# Patient Record
Sex: Male | Born: 2000
Health system: Southern US, Community
[De-identification: ages and names within clinical notes are randomized; demographics above are authoritative.]

## PROBLEM LIST (undated history)

## (undated) DIAGNOSIS — F341 Dysthymic disorder: Secondary | ICD-10-CM

## (undated) DIAGNOSIS — F419 Anxiety disorder, unspecified: Secondary | ICD-10-CM

## (undated) HISTORY — DX: Anxiety disorder, unspecified: F41.9

## (undated) HISTORY — DX: Dysthymic disorder: F34.1

---
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
## 2020-01-27 ENCOUNTER — Telehealth: Payer: Self-pay | Admitting: Psychiatry

## 2020-01-27 DIAGNOSIS — F411 Generalized anxiety disorder: Secondary | ICD-10-CM

## 2020-01-27 DIAGNOSIS — F4010 Social phobia, unspecified: Secondary | ICD-10-CM

## 2020-01-27 NOTE — Telephone Encounter (Signed)
Pt called and said that the buspar is not really working as well and he would like either something different or an extended release. He wakes up in the morning and he is not in a good mood. Please call him at [phone]

## 2020-01-27 NOTE — Telephone Encounter (Signed)
Phone call is returned at [phone] when disovering message after hours at 1704 that he does not feel good in the morning he attributes to lack of benefit from BuSpar asking if extended release would be better but there is not an extended release made. Otherwise he wants an alternative though he has not declared a follow up with provider in [place] or here for February or sooner, and I am retiring in several weeks. Though the patient could substitute Anafranil 75 mg nightly for the BuSpar twice daily while continuing the Pristiq, he has many comorbidities about which new medications become conflictual when he wants them for his mental health.  He does not answer the phone when I call and has a child's voice providing his email message then that the mailbox is full.  If he can come up with a plan for follow-up in our office or elsewhere, it may be possible to try the Anafranil in place of the BuSpar in the interim.

## 2020-01-28 NOTE — Addendum Note (Signed)
Addended by: Beverly Milch E on: 01/28/2020 01:20 PM   Modules accepted: Orders

## 2020-01-28 NOTE — Telephone Encounter (Signed)
Multiple attempts to reach patient have been unsuccessful with answering machine still full so that message cannot be left.  Simplest question he might be asking is whether the BuSpar is wearing off in the morning as he states he wishes a time release for waking symptoms and the same can be achieved by 3 times a day dosing every 8 hours rather than just covering the waking hours of the day.  I left that message on mother's phone as there is no answer on father's phone either discussing options he addressed last time for appointments whether in our office or in [place] if more complicated doubting that any change to Anafranil is able to be secured for them currently as my reitrement nears.

## 2020-03-03 ENCOUNTER — Other Ambulatory Visit: Payer: Self-pay | Admitting: Psychiatry

## 2020-03-03 DIAGNOSIS — F411 Generalized anxiety disorder: Secondary | ICD-10-CM

## 2020-03-03 DIAGNOSIS — F4010 Social phobia, unspecified: Secondary | ICD-10-CM

## 2020-04-30 ENCOUNTER — Other Ambulatory Visit: Payer: Self-pay | Admitting: Psychiatry

## 2020-04-30 DIAGNOSIS — F4010 Social phobia, unspecified: Secondary | ICD-10-CM

## 2020-04-30 DIAGNOSIS — F411 Generalized anxiety disorder: Secondary | ICD-10-CM

## 2020-04-30 DIAGNOSIS — F341 Dysthymic disorder: Secondary | ICD-10-CM

## 2020-05-08 DIAGNOSIS — F411 Generalized anxiety disorder: Secondary | ICD-10-CM | POA: Diagnosis not present

## 2020-05-08 DIAGNOSIS — F8081 Childhood onset fluency disorder: Secondary | ICD-10-CM | POA: Diagnosis not present

## 2020-05-08 DIAGNOSIS — F321 Major depressive disorder, single episode, moderate: Secondary | ICD-10-CM | POA: Diagnosis not present

## 2020-06-04 ENCOUNTER — Other Ambulatory Visit: Payer: Self-pay | Admitting: Psychiatry

## 2020-06-04 DIAGNOSIS — F4010 Social phobia, unspecified: Secondary | ICD-10-CM

## 2020-06-04 DIAGNOSIS — F411 Generalized anxiety disorder: Secondary | ICD-10-CM

## 2020-06-04 NOTE — Telephone Encounter (Signed)
Call to RS

## 2020-06-09 NOTE — Telephone Encounter (Signed)
Left message for pt to call back to schedule.

## 2020-06-27 ENCOUNTER — Other Ambulatory Visit: Payer: Self-pay | Admitting: Psychiatry

## 2020-06-27 DIAGNOSIS — F411 Generalized anxiety disorder: Secondary | ICD-10-CM

## 2020-06-27 DIAGNOSIS — F4010 Social phobia, unspecified: Secondary | ICD-10-CM

## 2020-07-23 DIAGNOSIS — F411 Generalized anxiety disorder: Secondary | ICD-10-CM | POA: Diagnosis not present

## 2020-07-23 DIAGNOSIS — F321 Major depressive disorder, single episode, moderate: Secondary | ICD-10-CM | POA: Diagnosis not present

## 2020-08-05 ENCOUNTER — Other Ambulatory Visit: Payer: Self-pay | Admitting: Psychiatry

## 2020-08-05 DIAGNOSIS — F4010 Social phobia, unspecified: Secondary | ICD-10-CM

## 2020-08-05 DIAGNOSIS — F411 Generalized anxiety disorder: Secondary | ICD-10-CM

## 2020-08-18 ENCOUNTER — Ambulatory Visit (INDEPENDENT_AMBULATORY_CARE_PROVIDER_SITE_OTHER): Payer: BC Managed Care – PPO | Admitting: Family Medicine

## 2020-08-18 ENCOUNTER — Encounter: Payer: Self-pay | Admitting: Family Medicine

## 2020-08-18 ENCOUNTER — Other Ambulatory Visit: Payer: Self-pay

## 2020-08-18 VITALS — BP 107/69 | HR 75 | Temp 97.6°F | Ht 76.0 in | Wt 310.2 lb

## 2020-08-18 DIAGNOSIS — F411 Generalized anxiety disorder: Secondary | ICD-10-CM | POA: Diagnosis not present

## 2020-08-18 DIAGNOSIS — Z0001 Encounter for general adult medical examination with abnormal findings: Secondary | ICD-10-CM | POA: Diagnosis not present

## 2020-08-18 DIAGNOSIS — F341 Dysthymic disorder: Secondary | ICD-10-CM | POA: Diagnosis not present

## 2020-08-18 MED ORDER — DESVENLAFAXINE SUCCINATE ER 50 MG PO TB24: 50.0000 mg | ORAL_TABLET | Freq: Every day | ORAL | 3 refills | Status: AC

## 2020-08-18 NOTE — Assessment & Plan Note (Signed)
Follows with psychiatry.  Symptoms are stable.  Continue current regimen of desvenlafaxine 150 mg daily, BuSpar 15 mg twice daily, and Xanax 2 mg daily.

## 2020-08-18 NOTE — Assessment & Plan Note (Signed)
Follows with psychiatry.  We will continue current regimen of desvenlafaxine 150 mg daily, Xanax 2 mg daily, and buspirone 15 mg twice daily.

## 2020-08-18 NOTE — Progress Notes (Signed)
Chief Complaint:  Craig Tanner is a 20 y.o. male who presents today for his annual comprehensive physical exam.    Assessment/Plan:  Chronic Problems Addressed Today: Persistent depressive disorder with atypical features, currently mild Follows with psychiatry.  We will continue current regimen of desvenlafaxine 150 mg daily, Xanax 2 mg daily, and buspirone 15 mg twice daily.  GAD (generalized anxiety disorder) Follows with psychiatry.  Symptoms are stable.  Continue current regimen of desvenlafaxine 150 mg daily, BuSpar 15 mg twice daily, and Xanax 2 mg daily.   Body mass index is 37.76 kg/m. / Obese.  BMI Metric Follow Up - 08/18/20 1532       BMI Metric Follow Up-Please document annually   BMI Metric Follow Up Education provided              Preventative Healthcare: Labs deferred  Patient Counseling(The following topics were reviewed and/or handout was given):  -Nutrition: Stressed importance of moderation in sodium/caffeine intake, saturated fat and cholesterol, caloric balance, sufficient intake of fresh fruits, vegetables, and fiber.  -Stressed the importance of regular exercise.   -Substance Abuse: Discussed cessation/primary prevention of tobacco, alcohol, or other drug use; driving or other dangerous activities under the influence; availability of treatment for abuse.   -Injury prevention: Discussed safety belts, safety helmets, smoke detector, smoking near bedding or upholstery.   -Sexuality: Discussed sexually transmitted diseases, partner selection, use of condoms, avoidance of unintended pregnancy and contraceptive alternatives.   -Dental health: Discussed importance of regular tooth brushing, flossing, and dental visits.  -Health maintenance and immunizations reviewed. Please refer to Health maintenance section.  Return to care in 1 year for next preventative visit.     Subjective:  HPI:   He has no acute complaints today. He is doing reasonably  well. He is currently finding a job and may possibly enroll in [employer] next semester. No longer taking Prozac. He needs a refill on Rx.  Lifestyle Diet: Balanced  Exercise: Participating in exercise few times per week.  Depression screen PHQ 2/9 08/18/2020  Decreased Interest 1  Down, Depressed, Hopeless 1  PHQ - 2 Score 2  Altered sleeping 1  Tired, decreased energy 2  Change in appetite 1  Feeling bad or failure about yourself  0  Trouble concentrating 1  Moving slowly or fidgety/restless 0  Suicidal thoughts 0  PHQ-9 Score 7  Difficult doing work/chores Not difficult at all    Health Maintenance Due  Topic Date Due   HIV Screening  Never done   Hepatitis C Screening  Never done   COVID-19 Vaccine (3 - Booster for Pfizer series) 10/11/2019     ROS: Per HPI, otherwise a complete review of systems was negative.   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 No pertinent surgical history.  Family History  Problem Relation Age of Onset   Blindness Father    Cancer Neg Hx     Medications- reviewed and updated Current Outpatient Medications  Medication Sig Dispense Refill   ALPRAZolam (XANAX XR) 2 MG 24 hr tablet Take 1 tablet (2 mg total) by mouth  daily after breakfast. 90 tablet 1   busPIRone (BUSPAR) 15 MG tablet TAKE 1 TABLET BY MOUTH 2 TIMES DAILY. 60 tablet 1   clindamycin (CLEOCIN T) 1 % external solution APPLY TO AFFECTED AREA EVERY DAY 30 mL 11   desvenlafaxine (PRISTIQ) 100 MG 24 hr tablet Take by mouth.     desvenlafaxine (PRISTIQ) 50 MG 24 hr tablet  Take 1 tablet (50 mg total) by mouth daily. Takes 150mg  total daily. 30 tablet 3   No current facility-administered medications for this visit.    Allergies-reviewed and updated Allergies  Allergen Reactions   Albuterol     Social History   Socioeconomic History   Marital status: Single    Spouse name: Not on file   Number of children: Not on file   Years of education: Not on file   Highest education level: Not on file  Occupational History   Not on file  Tobacco Use   Smoking status: Never   Smokeless tobacco: Never  Vaping Use   Vaping Use: Never used  Substance and Sexual Activity   Alcohol use: Never   Drug use: Never   Sexual activity: Never  Other Topics Concern   Not on file  Social History Narrative   Not on file   Social Determinants of Health   Financial Resource Strain: Not on file  Food Insecurity: Not on file  Transportation Needs: Not on file  Physical Activity: Not on file  Stress: Not on file  Social Connections: Not on file        Objective:  Physical Exam: BP 107/69   Pulse 75   Temp 97.6 F (36.4 C) (Temporal)   Ht 6\' 4"  (1.93 m)   Wt (!) 310 lb 3.2 oz (140.7 kg)   SpO2 96%   BMI 37.76 kg/m   Body mass index is 37.76 kg/m. Wt Readings from Last 3 Encounters:  08/18/20 (!) 310 lb 3.2 oz (140.7 kg)  06/04/19 (!) 315 lb (142.9 kg) (>99 %, Z= 3.20)*  01/21/19 (!) 342 lb 6.1 oz (155.3 kg) (>99 %, Z= 3.41)*   * Growth percentiles are based on CDC (Boys, 2-20 Years) data.   Gen: NAD, resting comfortably HEENT: TMs normal bilaterally. OP clear. No thyromegaly noted.  CV: RRR with no murmurs appreciated Pulm: NWOB, CTAB with no crackles, wheezes, or rhonchi GI: Normal bowel sounds present. Soft, Nontender, Nondistended. MSK: no edema, cyanosis, or clubbing noted Skin: warm, dry Neuro: CN2-12 grossly intact. Strength 5/5 in upper and lower extremities. Reflexes symmetric and intact bilaterally.  Psych: Normal affect and thought  content     I,Alexis Bryant,acting as a scribe for 06/06/19, MD.,have documented all relevant documentation on the behalf of 01/23/19, MD,as directed by  Jacquiline Doe, MD while in the presence of Jacquiline Doe, MD.   I, Jacquiline Doe, MD, have reviewed all documentation for this visit. The documentation on 08/18/20 for the exam, diagnosis, procedures, and orders are all accurate and complete.   Jacquiline Doe. 10/19/20, MD 08/18/2020 3:32 PM

## 2020-08-18 NOTE — Patient Instructions (Signed)
It was very nice to see you today!  I will refill your medication.  Please continue work on diet and exercise.  I will see you back in a year for your next checkup.  Please come back to see me sooner if needed.  Take care, Dr Jimmey Ralph  PLEASE NOTE:  If you had any lab tests please let us know if you have not heard back within a few days. You may see your results on mychart before we have a chance to review them but we will give you a call once they are reviewed by [place]. If we ordered any referrals today, please let us know if you have not heard from their office within the next week.   Please try these tips to maintain a healthy lifestyle:  Eat at least 3 REAL meals and 1-2 snacks per day.  Aim for no more than 5 hours between eating.  If you eat breakfast, please do so within one hour of getting up.   Each meal should contain half fruits/vegetables, one quarter protein, and one quarter carbs (no bigger than a computer mouse)  Cut down on sweet beverages. This includes juice, soda, and sweet tea.   Drink at least 1 glass of water with each meal and aim for at least 8 glasses per day  Exercise at least 150 minutes every week.    Preventive Care 36-76 Years Old, Male Preventive care refers to lifestyle choices and visits with your health care provider that can promote health and wellness. At this stage in your life, you may start seeing a primary care physician instead of a pediatrician. It is important to take responsibility for your health and well-being. Preventive care for young adults includes: A yearly physical exam. This is also called an annual wellness visit. Regular dental and eye exams. Immunizations. Screening for certain conditions. Healthy lifestyle choices, such as: Eating a healthy diet. Getting regular exercise. Not using drugs or products that contain nicotine and tobacco. Limiting alcohol use. What can I expect for my preventive care visit? Physical exam Your  health care provider may check your: Height and weight. These may be used to calculate your BMI (body mass index). BMI is a measurement that tells if you are at a healthy weight. Heart rate and blood pressure. Body temperature. Skin for abnormal spots. Counseling Your health care provider may ask you questions about your: Past medical problems. Family's medical history. Alcohol, tobacco, and drug use. Home life and relationship well-being. Access to firearms. Emotional well-being. Diet, exercise, and sleep habits. Sexual activity and sexual health. What immunizations do I need?  Vaccines are usually given at various ages, according to a schedule. Your health care provider will recommend vaccines for you based on your age, medicalhistory, and lifestyle or other factors, such as travel or where you work. What tests do I need? Blood tests Lipid and cholesterol levels. These may be checked every 5 years starting at age 61. Hepatitis C test. Hepatitis B test. Screening Genital exam to check for testicular cancer or hernias. STD (sexually transmitted disease) testing, if you are at risk. Other tests Tuberculosis skin test. Vision and hearing tests. Skin exam. Talk with your health care provider about your test results, treatment options,and if necessary, the need for more tests. Follow these instructions at home: Eating and drinking  Eat a healthy diet that includes fresh fruits and vegetables, whole grains, lean protein, and low-fat dairy products. Drink enough fluid to keep your urine pale yellow.  Do not drink alcohol if: Your health care provider tells you not to drink. You are under the legal drinking age. In the [place] the legal drinking age is 38. If you drink alcohol: Limit how much you use to 0-2 drinks a day. Be aware of how much alcohol is in your drink. In the [place] one drink equals one 12 oz bottle of beer (355 mL), one 5 oz glass of wine (148 mL), or one 1 oz glass of  hard liquor (44 mL).  Lifestyle Take daily care of your teeth and gums. Brush your teeth every morning and night with fluoride toothpaste. Floss one time each day. Stay active. Exercise for at least 30 minutes 5 or more days of the week. Do not use any products that contain nicotine or tobacco, such as cigarettes, e-cigarettes, and chewing tobacco. If you need help quitting, ask your health care provider. Do not use drugs. If you are sexually active, practice safe sex. Use a condom or other form of protection to prevent STIs (sexually transmitted infections). Find healthy ways to cope with stress, such as: Meditation, yoga, or listening to music. Journaling. Talking to a trusted person. Spending time with friends and family. Safety Always wear your seat belt while driving or riding in a vehicle. Do not drive: If you have been drinking alcohol. Do not ride with someone who has been drinking. When you are tired or distracted. While texting. Wear a helmet and other protective equipment during sports activities. If you have firearms in your house, make sure you follow all gun safety procedures. Seek help if you have been bullied, physically abused, or sexually abused. Use the Internet responsibly to avoid dangers, such as online bullying and online sex predators. What's next? Go to your health care provider once a year for an annual wellness visit. Ask your health care provider how often you should have your eyes and teeth checked. Stay up to date on all vaccines. This information is not intended to replace advice given to you by your health care provider. Make sure you discuss any questions you have with your healthcare provider. Document Revised: 10/10/2018 Document Reviewed: 01/18/2018 Elsevier Patient Education  2022 [employer].

## 2020-09-15 ENCOUNTER — Ambulatory Visit: Payer: BC Managed Care – PPO | Admitting: Sports Medicine

## 2020-09-15 ENCOUNTER — Other Ambulatory Visit: Payer: Self-pay

## 2020-09-15 DIAGNOSIS — M2142 Flat foot [pes planus] (acquired), left foot: Secondary | ICD-10-CM | POA: Diagnosis not present

## 2020-09-15 DIAGNOSIS — M2141 Flat foot [pes planus] (acquired), right foot: Secondary | ICD-10-CM

## 2020-09-15 NOTE — Progress Notes (Signed)
   Subjective:    Patient ID: Craig Tanner, male    DOB: [birth year], 20 y.o.   MRN: [id number]  HPI chief complaint: "I need new inserts"  Craig Tanner presents today requesting 2 new pairs of green sports insoles and scaphoid pads.  He has a well-documented history of pes planus.  We had previously discussed custom orthotics.  He would like to discuss this with his insurance company first.  He denies any pain currently.  Past medical history reviewed Medications reviewed Allergies reviewed    Review of Systems As above    Objective:   Physical Exam  Well-developed, well-nourished.  No acute distress  Examination of his feet in the standing position shows complete collapse of the longitudinal arch bilaterally.  No tenderness to palpation.  Full ankle range of motion.  Good pulses.      Assessment & Plan:   Pes planus  Patient is given two new pairs of green sports insoles and scaphoid pads.  I also provided him with the information to be able to order directly from [facility] should he so desire.  He would do well with custom orthotics.  He Craig Tanner check with his insurance to see if this is something that he would like to pursue.  He may follow-up at his convenience for those.

## 2021-03-26 DIAGNOSIS — F321 Major depressive disorder, single episode, moderate: Secondary | ICD-10-CM | POA: Diagnosis not present

## 2021-03-26 DIAGNOSIS — Z23 Encounter for immunization: Secondary | ICD-10-CM | POA: Diagnosis not present

## 2021-03-26 DIAGNOSIS — F411 Generalized anxiety disorder: Secondary | ICD-10-CM | POA: Diagnosis not present

## 2021-05-14 DIAGNOSIS — F4323 Adjustment disorder with mixed anxiety and depressed mood: Secondary | ICD-10-CM | POA: Diagnosis not present

## 2021-06-18 DIAGNOSIS — F4323 Adjustment disorder with mixed anxiety and depressed mood: Secondary | ICD-10-CM | POA: Diagnosis not present

## 2021-08-20 ENCOUNTER — Encounter: Payer: Self-pay | Admitting: Family Medicine

## 2021-08-20 ENCOUNTER — Ambulatory Visit (INDEPENDENT_AMBULATORY_CARE_PROVIDER_SITE_OTHER): Payer: BC Managed Care – PPO | Admitting: Family Medicine

## 2021-08-20 VITALS — BP 120/80 | HR 65 | Temp 97.6°F | Ht 75.0 in | Wt 310.2 lb

## 2021-08-20 DIAGNOSIS — F411 Generalized anxiety disorder: Secondary | ICD-10-CM

## 2021-08-20 DIAGNOSIS — Z1159 Encounter for screening for other viral diseases: Secondary | ICD-10-CM | POA: Diagnosis not present

## 2021-08-20 DIAGNOSIS — F321 Major depressive disorder, single episode, moderate: Secondary | ICD-10-CM | POA: Diagnosis not present

## 2021-08-20 DIAGNOSIS — Z131 Encounter for screening for diabetes mellitus: Secondary | ICD-10-CM

## 2021-08-20 DIAGNOSIS — Z114 Encounter for screening for human immunodeficiency virus [HIV]: Secondary | ICD-10-CM

## 2021-08-20 DIAGNOSIS — Z0001 Encounter for general adult medical examination with abnormal findings: Secondary | ICD-10-CM | POA: Diagnosis not present

## 2021-08-20 DIAGNOSIS — Z13220 Encounter for screening for lipoid disorders: Secondary | ICD-10-CM

## 2021-08-20 LAB — CBC
HCT: 46.5 % (ref 39.0–52.0)
Hemoglobin: 15.7 g/dL (ref 13.0–17.0)
MCHC: 33.7 g/dL (ref 30.0–36.0)
MCV: 87.7 fl (ref 78.0–100.0)
Platelets: 306 10*3/uL (ref 150.0–400.0)
RBC: 5.3 Mil/uL (ref 4.22–5.81)
RDW: 13 % (ref 11.5–15.5)
WBC: 7.4 10*3/uL (ref 4.0–10.5)

## 2021-08-20 LAB — COMPREHENSIVE METABOLIC PANEL
ALT: 48 U/L (ref 0–53)
AST: 38 U/L — ABNORMAL HIGH (ref 0–37)
Albumin: 4.7 g/dL (ref 3.5–5.2)
Alkaline Phosphatase: 76 U/L (ref 39–117)
BUN: 12 mg/dL (ref 6–23)
CO2: 27 mEq/L (ref 19–32)
Calcium: 10 mg/dL (ref 8.4–10.5)
Chloride: 104 mEq/L (ref 96–112)
Creatinine, Ser: 1.17 mg/dL (ref 0.40–1.50)
GFR: 89.28 mL/min (ref >60.00)
Glucose, Bld: 91 mg/dL (ref 70–99)
Potassium: 4.8 mEq/L (ref 3.5–5.1)
Sodium: 139 mEq/L (ref 135–145)
Total Bilirubin: 0.9 mg/dL (ref 0.2–1.2)
Total Protein: 7.3 g/dL (ref 6.0–8.3)

## 2021-08-20 LAB — LIPID PANEL
Cholesterol: 197 mg/dL (ref 0–200)
HDL: 35.6 mg/dL — ABNORMAL LOW (ref >39.00)
NonHDL: 161.42
Total CHOL/HDL Ratio: 6
Triglycerides: 268 mg/dL — ABNORMAL HIGH (ref 0.0–149.0)
VLDL: 53.6 mg/dL — ABNORMAL HIGH (ref 0.0–40.0)

## 2021-08-20 LAB — LDL CHOLESTEROL, DIRECT: Direct LDL: 118 mg/dL

## 2021-08-20 LAB — TSH: TSH: 3.96 u[IU]/mL (ref 0.35–5.50)

## 2021-08-20 LAB — HEMOGLOBIN A1C: Hgb A1c MFr Bld: 5.6 % (ref 4.6–6.5)

## 2021-08-20 NOTE — Progress Notes (Signed)
Chief Complaint:  Craig Tanner is a 21 y.o. male who presents today for his annual comprehensive physical exam.    Assessment/Plan:  Chronic Problems Addressed Today: Depression, major, single episode, moderate (HCC) Follows with psychiatry. She is currently on prestiq 150mg  daily  Generalized anxiety disorder Follows with psychiatry.  He is on Pristiq 150 mg daily, BuSpar 15 mg twice daily, and Xanax 2 mg daily as needed.  Preventative Healthcare: Check labs.  Patient Counseling(The following topics were reviewed and/or handout was given):  -Nutrition: Stressed importance of moderation in sodium/caffeine intake, saturated fat and cholesterol, caloric balance, sufficient intake of fresh fruits, vegetables, and fiber.  -Stressed the importance of regular exercise.   -Substance Abuse: Discussed cessation/primary prevention of tobacco, alcohol, or other drug use; driving or other dangerous activities under the influence; availability of treatment for abuse.   -Injury prevention: Discussed safety belts, safety helmets, smoke detector, smoking near bedding or upholstery.   -Sexuality: Discussed sexually transmitted diseases, partner selection, use of condoms, avoidance of unintended pregnancy and contraceptive alternatives.   -Dental health: Discussed importance of regular tooth brushing, flossing, and dental visits.  -Health maintenance and immunizations reviewed. Please refer to Health maintenance section.  Return to care in 1 year for next preventative visit.     Subjective:  HPI:  He has no acute complaints today.  See A/P for status of chronic conditions.  Lifestyle Diet: None specific.  Exercise: Going to the gym. Cardio and weight training.      08/20/2021   11:59 AM  Depression screen PHQ 2/9  Decreased Interest 0  Down, Depressed, Hopeless 0  PHQ - 2 Score 0    Health Maintenance Due  Topic Date Due   Hepatitis C Screening  Never done     ROS: Per HPI,  otherwise a complete review of systems was negative.   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 No pertinent surgical history.  Family History  Problem Relation Age of Onset   Blindness Father    Cancer Neg Hx     Medications- reviewed and updated Current Outpatient Medications  Medication Sig Dispense Refill   ALPRAZolam (XANAX XR) 2 MG 24 hr tablet Take 1 tablet (2 mg total) by mouth daily after breakfast. 90 tablet 1   busPIRone (BUSPAR) 15 MG tablet TAKE 1 TABLET BY MOUTH 2 TIMES DAILY. 60 tablet 1   desvenlafaxine (PRISTIQ) 100 MG 24 hr tablet Take by mouth.     desvenlafaxine (PRISTIQ) 50 MG 24 hr tablet Take 1 tablet (50 mg total) by mouth daily. Takes 150mg  total daily. 30 tablet 3   No current facility-administered medications for this visit.    Allergies-reviewed and updated Allergies  Allergen Reactions   Albuterol     Social History   Socioeconomic History   Marital status: Single    Spouse name: Not on file   Number of children: Not on file   Years of education: Not on file   Highest education level: Not on file  Occupational History   Not on file  Tobacco Use   Smoking status: Never   Smokeless tobacco: Never  Vaping Use   Vaping Use: Never used  Substance and Sexual Activity   Alcohol use: Never   Drug use: Never   Sexual activity: Never  Other Topics Concern   Not on file  Social History Narrative   Not on file   Social Determinants of Health   Financial Resource Strain: Not on file  Food Insecurity: Not on file  Transportation Needs: Not on file  Physical Activity: Not  on file  Stress: Not on file  Social Connections: Not on file        Objective:  Physical Exam: BP 120/80   Pulse 65   Temp 97.6 F (36.4 C) (Temporal)   Ht 6\' 3"  (1.905 m)   Wt (!) 310 lb 3.2 oz (140.7 kg)   SpO2 97%   BMI 38.77 kg/m   Body mass index is 38.77 kg/m. Wt Readings from Last 3 Encounters:  08/20/21 (!) 310 lb 3.2 oz (140.7 kg)  09/15/20 (!) 305 lb (138.3 kg)  08/18/20 (!) 310 lb 3.2 oz (140.7 kg)   Gen: NAD, resting comfortably HEENT: TMs normal bilaterally. OP clear. No thyromegaly noted.  CV: RRR with no murmurs appreciated Pulm: NWOB, CTAB with no crackles, wheezes, or rhonchi GI: Normal bowel sounds present. Soft, Nontender, Nondistended. MSK: no edema, cyanosis, or clubbing noted Skin: warm, dry Neuro: CN2-12 grossly intact. Strength 5/5 in upper and lower extremities. Reflexes symmetric and intact bilaterally.  Psych: Normal affect and thought content     Zamya Culhane M. 10/19/20, MD 08/20/2021 12:30 PM

## 2021-08-20 NOTE — Assessment & Plan Note (Signed)
Follows with psychiatry. She is currently on prestiq 150mg  daily

## 2021-08-20 NOTE — Patient Instructions (Signed)
It was very nice to see you today!  We will check blood work.  Please keep up the good work with diet and exercise.  I will see you back in year.  Come back sooner if needed.  Take care, Dr Jimmey Ralph  PLEASE NOTE:  If you had any lab tests please let us know if you have not heard back within a few days. You may see your results on mychart before we have a chance to review them but we will give you a call once they are reviewed by [place]. If we ordered any referrals today, please let us know if you have not heard from their office within the next week.   Please try these tips to maintain a healthy lifestyle:  Eat at least 3 REAL meals and 1-2 snacks per day.  Aim for no more than 5 hours between eating.  If you eat breakfast, please do so within one hour of getting up.   Each meal should contain half fruits/vegetables, one quarter protein, and one quarter carbs (no bigger than a computer mouse)  Cut down on sweet beverages. This includes juice, soda, and sweet tea.   Drink at least 1 glass of water with each meal and aim for at least 8 glasses per day  Exercise at least 150 minutes every week.    Preventive Care 35-41 Years Old, Male Preventive care refers to lifestyle choices and visits with your health care provider that can promote health and wellness. Preventive care visits are also called wellness exams. What can I expect for my preventive care visit? Counseling During your preventive care visit, your health care provider may ask about your: Medical history, including: Past medical problems. Family medical history. Current health, including: Emotional well-being. Home life and relationship well-being. Sexual activity. Lifestyle, including: Alcohol, nicotine or tobacco, and drug use. Access to firearms. Diet, exercise, and sleep habits. Safety issues such as seatbelt and bike helmet use. Sunscreen use. Work and work Astronomer. Physical exam Your health care provider may  check your: Height and weight. These may be used to calculate your BMI (body mass index). BMI is a measurement that tells if you are at a healthy weight. Waist circumference. This measures the distance around your waistline. This measurement also tells if you are at a healthy weight and may help predict your risk of certain diseases, such as type 2 diabetes and high blood pressure. Heart rate and blood pressure. Body temperature.  Vaccines are usually given at various ages, according to a schedule. Your health care provider will recommend vaccines for you based on your age, medical history, and lifestyle or other factors, such as travel or where you work. What tests do I need? Screening Your health care provider may recommend screening tests for certain conditions. This may include: Lipid and cholesterol levels. Diabetes screening. This is done by checking your blood sugar (glucose) after you have not eaten for a while (fasting). Hepatitis B test. Hepatitis C test. HIV (human immunodeficiency virus) test. STI (sexually transmitted infection) testing, if you are at risk. Talk with your health care provider about your test results, treatment options, and if necessary, the need for more tests. Follow these instructions at home: Eating and drinking  Eat a healthy diet that includes fresh fruits and vegetables, whole grains, lean protein, and low-fat dairy products. Drink enough fluid to keep your urine pale yellow. Take vitamin and mineral supplements as recommended by your health care provider. Do not drink alcohol if your  health care provider tells you not to drink. If you drink alcohol: Limit how much you have to 0-2 drinks a day. Know how much alcohol is in your drink. In the [place] one drink equals one 12 oz bottle of beer (355 mL), one 5 oz glass of wine (148 mL), or one 1 oz glass of hard liquor (44 mL). Lifestyle Brush your teeth every morning and night with fluoride toothpaste. Floss  one time each day. Exercise for at least 30 minutes 5 or more days each week. Do not use any products that contain nicotine or tobacco. These products include cigarettes, chewing tobacco, and vaping devices, such as e-cigarettes. If you need help quitting, ask your health care provider. Do not use drugs. If you are sexually active, practice safe sex. Use a condom or other form of protection to prevent STIs. Find healthy ways to manage stress, such as: Meditation, yoga, or listening to music. Journaling. Talking to a trusted person. Spending time with friends and family. Minimize exposure to UV radiation to reduce your risk of skin cancer. Safety Always wear your seat belt while driving or riding in a vehicle. Do not drive: If you have been drinking alcohol. Do not ride with someone who has been drinking. If you have been using any mind-altering substances or drugs. While texting. When you are tired or distracted. Wear a helmet and other protective equipment during sports activities. If you have firearms in your house, make sure you follow all gun safety procedures. Seek help if you have been physically or sexually abused. What's next? Go to your health care provider once a year for an annual wellness visit. Ask your health care provider how often you should have your eyes and teeth checked. Stay up to date on all vaccines. This information is not intended to replace advice given to you by your health care provider. Make sure you discuss any questions you have with your health care provider. Document Revised: 07/22/2020 Document Reviewed: 07/22/2020 Elsevier Patient Education  2023 [employer].

## 2021-08-20 NOTE — Assessment & Plan Note (Signed)
Follows with psychiatry.  He is on Pristiq 150 mg daily, BuSpar 15 mg twice daily, and Xanax 2 mg daily as needed.

## 2021-08-23 ENCOUNTER — Encounter: Payer: Self-pay | Admitting: Family Medicine

## 2021-08-23 DIAGNOSIS — E785 Hyperlipidemia, unspecified: Secondary | ICD-10-CM | POA: Insufficient documentation

## 2021-08-23 LAB — HIV ANTIBODY (ROUTINE TESTING W REFLEX): HIV 1&2 Ab, 4th Generation: NONREACTIVE

## 2021-08-23 LAB — HEPATITIS C ANTIBODY: Hepatitis C Ab: NONREACTIVE

## 2021-08-23 NOTE — Progress Notes (Signed)
Please inform patient of the following:  Cholesterol levels are borderline but everything else is stable.  He should continue to work on diet and exercise and we can recheck in a few years.

## 2021-09-09 ENCOUNTER — Ambulatory Visit: Payer: BC Managed Care – PPO

## 2022-07-29 ENCOUNTER — Ambulatory Visit (INDEPENDENT_AMBULATORY_CARE_PROVIDER_SITE_OTHER): Payer: PRIVATE HEALTH INSURANCE | Admitting: Sports Medicine

## 2022-07-29 VITALS — BP 128/80 | Ht 75.0 in | Wt 310.0 lb

## 2022-07-29 DIAGNOSIS — M2142 Flat foot [pes planus] (acquired), left foot: Secondary | ICD-10-CM

## 2022-07-29 DIAGNOSIS — M2141 Flat foot [pes planus] (acquired), right foot: Secondary | ICD-10-CM | POA: Diagnosis not present

## 2022-07-29 NOTE — Progress Notes (Signed)
  Craig Tanner - 22 y.o. male MRN [id number]  Date of birth: 04-17-2000    CHIEF COMPLAINT:   pes planus    SUBJECTIVE:   HPI:   Pleasant 22 year old male with history of bilateral pes planus here for custom orthotics.  He has green sport orthotics made for him here in 2022.  He ha reordered these online and been wearing them daily but is wearing them out.  He wants custom ones today.  Patient was fitted for a : standard, cushioned, semi-rigid orthotic. The orthotic was heated and afterward the patient stood on the orthotic blank positioned on the orthotic stand. The patient was positioned in subtalar neutral position and 10 degrees of ankle dorsiflexion in a weight bearing stance. After completion of molding, a stable base was applied to the orthotic blank. The blank was ground to a stable position for weight bearing. Size: 14 Base: blue EVA Posting: none Additional orthotic padding: none  Gait was neutral with orthotics in place. Patient found them to be comfortable. Follow-up as needed.    Arvella Nigh, MD PGY-4, Sports Medicine Fellow [facility]  Addendum:  I was the preceptor for this visit and available for immediate consultation.  Norton Blizzard MD Marrianne Mood

## 2022-08-24 ENCOUNTER — Encounter: Payer: Self-pay | Admitting: Nurse Practitioner

## 2022-08-24 ENCOUNTER — Ambulatory Visit: Payer: PRIVATE HEALTH INSURANCE | Admitting: Nurse Practitioner

## 2022-08-24 VITALS — BP 136/84 | HR 101 | Temp 98.4°F | Ht 75.0 in | Wt 318.0 lb

## 2022-08-24 DIAGNOSIS — L259 Unspecified contact dermatitis, unspecified cause: Secondary | ICD-10-CM | POA: Diagnosis not present

## 2022-08-24 MED ORDER — TRIAMCINOLONE ACETONIDE 0.1 % EX CREA: 1.0000 | TOPICAL_CREAM | Freq: Two times a day (BID) | CUTANEOUS | 0 refills | Status: DC

## 2022-08-24 NOTE — Patient Instructions (Signed)
It was great to see you!  Start triamcinolone cream twice a day to the affected areas on your ankle, thigh, and arm.   Try not to scratch the areas.   Let's follow-up if your symptoms worsen or don't improve.   Take care,  Rodman Pickle, NP

## 2022-08-24 NOTE — Progress Notes (Signed)
   Acute Office Visit  Subjective:     Patient ID: Craig Tanner, male    DOB: Aug 09, 2000, 22 y.o.   MRN: [id number]  Chief Complaint  Patient presents with   Poison Ivy    On left lower leg and thigh and right forearm   HPI:  Patient is in today for rash/poison ivy to his left leg, hip and right forearm for the last few days. He states it started after he was running outside. He did run through some brush.   RASH  Duration:  days  Location: arms and legs  Itching: yes Burning: no Redness: yes Oozing: no Scaling: no Blisters: no Painful: no Fevers: no Change in detergents/soaps/personal care products: no Recent illness: no Recent travel:no History of same: no Context: stable Alleviating factors:  poison ivy cream Treatments attempted: poison ivy cream Shortness of breath: no  Throat/tongue swelling: no Myalgias/arthralgias: no  ROS See pertinent positives and negatives per HPI.     Objective:    BP 136/84 (BP Location: Left Arm, Cuff Size: Large)   Pulse (!) 101   Temp 98.4 F (36.9 C) (Oral)   Ht 6\' 3"  (1.905 m)   Wt (!) 318 lb (144.2 kg)   SpO2 98%   BMI 39.75 kg/m    Physical Exam Vitals and nursing note reviewed.  Constitutional:      Appearance: Normal appearance.  HENT:     Head: Normocephalic.  Eyes:     Conjunctiva/sclera: Conjunctivae normal.  Pulmonary:     Effort: Pulmonary effort is normal.  Musculoskeletal:     Cervical back: Normal range of motion.  Skin:    General: Skin is warm.     Findings: Rash (raised fluid filled bumps around left ankle, thigh, and one spot on right forearm) present.  Neurological:     General: No focal deficit present.     Mental Status: He is alert and oriented to person, place, and time.  Psychiatric:        Mood and Affect: Mood normal.        Behavior: Behavior normal.        Thought Content: Thought content normal.        Judgment: Judgment normal.       Assessment & Plan:   Problem  List Items Addressed This Visit   None Visit Diagnoses     Contact dermatitis, unspecified contact dermatitis type, unspecified trigger    -  Primary   Start triamcinolone cream to affected areas twice daily. Try not to scratch. Wash hands frequently. F/U if not improving.       Meds ordered this encounter  Medications   triamcinolone cream (KENALOG) 0.1 %    Sig: Apply 1 Application topically 2 (two) times daily.    Dispense:  30 g    Refill:  0    Return if symptoms worsen or fail to improve.  Gerre Scull, NP

## 2022-08-26 ENCOUNTER — Encounter: Payer: BC Managed Care – PPO | Admitting: Family Medicine

## 2022-08-26 ENCOUNTER — Other Ambulatory Visit: Payer: Self-pay | Admitting: Nurse Practitioner

## 2022-08-26 ENCOUNTER — Telehealth: Payer: Self-pay | Admitting: Family Medicine

## 2022-08-26 MED ORDER — TRIAMCINOLONE ACETONIDE 0.1 % EX CREA: 1.0000 | TOPICAL_CREAM | Freq: Two times a day (BID) | CUTANEOUS | 0 refills | Status: DC

## 2022-08-26 NOTE — Addendum Note (Signed)
Addended by: Rodman Pickle A on: 08/26/2022 02:53 PM   Modules accepted: Orders

## 2022-08-26 NOTE — Telephone Encounter (Signed)
Called pharmacy and patient picked up the medication on 08/24/22.  Then called patient and he states that the rash is better but some spots remain.  He would like to know if he can get a refill on the Triamcinolone Cream.   Please review and advise.  Thanks. Dm/cma

## 2022-08-26 NOTE — Telephone Encounter (Signed)
I see where you sent an order for triamcinolone cream (KENALOG) 0.1 % [[id number]] [facility] is saying they do not have that order. He is asking that it be resent.

## 2022-08-26 NOTE — Telephone Encounter (Signed)
Patient notified VIA phone. Dm/cma  

## 2022-11-11 ENCOUNTER — Ambulatory Visit: Payer: PRIVATE HEALTH INSURANCE | Admitting: Family Medicine

## 2022-11-11 ENCOUNTER — Encounter: Payer: Self-pay | Admitting: Family Medicine

## 2022-11-11 VITALS — BP 116/61 | HR 77 | Temp 97.8°F | Ht 75.0 in | Wt 328.6 lb

## 2022-11-11 DIAGNOSIS — F411 Generalized anxiety disorder: Secondary | ICD-10-CM | POA: Diagnosis not present

## 2022-11-11 DIAGNOSIS — Z23 Encounter for immunization: Secondary | ICD-10-CM | POA: Diagnosis not present

## 2022-11-11 DIAGNOSIS — F321 Major depressive disorder, single episode, moderate: Secondary | ICD-10-CM

## 2022-11-11 DIAGNOSIS — Z0000 Encounter for general adult medical examination without abnormal findings: Secondary | ICD-10-CM | POA: Diagnosis not present

## 2022-11-11 DIAGNOSIS — E785 Hyperlipidemia, unspecified: Secondary | ICD-10-CM

## 2022-11-11 NOTE — Assessment & Plan Note (Signed)
Following with psychiatry.  Symptoms are reasonably well-controlled.  Currently on Pristiq 150 mg daily and BuSpar 15 mg twice daily.

## 2022-11-11 NOTE — Progress Notes (Signed)
Chief Complaint:  Craig Tanner is a 22 y.o. male who presents today for his annual comprehensive physical exam.    Assessment/Plan:  Chronic Problems Addressed Today: Generalized anxiety disorder Following with psychiatry.  Symptoms are reasonably well-controlled.  Currently on Pristiq 150 mg daily and BuSpar 15 mg twice daily.  Depression, major, single episode, moderate (HCC) Following with psychiatry.  On Pristiq 150 mg daily.  Will follow-up with him again soon.  We discussed referral to therapy however he declined.  Dyslipidemia Discussed lifestyle modifications.  Deferred checking labs today.  Preventative Healthcare: Flu shot given today.   Patient Counseling(The following topics were reviewed and/or handout was given):  -Nutrition: Stressed importance of moderation in sodium/caffeine intake, saturated fat and cholesterol, caloric balance, sufficient intake of fresh fruits, vegetables, and fiber.  -Stressed the importance of regular exercise.   -Substance Abuse: Discussed cessation/primary prevention of tobacco, alcohol, or other drug use; driving or other dangerous activities under the influence; availability of treatment for abuse.   -Injury prevention: Discussed safety belts, safety helmets, smoke detector, smoking near bedding or upholstery.   -Sexuality: Discussed sexually transmitted diseases, partner selection, use of condoms, avoidance of unintended pregnancy and contraceptive alternatives.   -Dental health: Discussed importance of regular tooth brushing, flossing, and dental visits.  -Health maintenance and immunizations reviewed. Please refer to Health maintenance section.  Return to care in 1 year for next preventative visit.     Subjective:  HPI:  He has no acute complaints today. See Assessment / plan for status of chronic conditions.   Lifestyle Diet: Balanced. Plenty of fruits and vegetables.  Exercise: Working out every other day. Cardio and  weight lifting.      11/11/2022   11:17 AM  Depression screen PHQ 2/9  Decreased Interest 1  Down, Depressed, Hopeless 1  PHQ - 2 Score 2  Altered sleeping 0  Tired, decreased energy 1  Change in appetite 1  Feeling bad or failure about yourself  1  Trouble concentrating 1  Moving slowly or fidgety/restless 0  Suicidal thoughts 1  PHQ-9 Score 7  Difficult doing work/chores Not difficult at all    Health Maintenance Due  Topic Date Due   DTaP/Tdap/Td (8 - Td or Tdap) 07/24/2021     ROS: Per HPI, otherwise a complete review of systems was negative.   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 No pertinent surgical history.  Family History  Problem Relation Age of Onset   Blindness Father    Cancer Neg Hx     Medications- reviewed and updated Current Outpatient Medications  Medication Sig Dispense Refill   busPIRone (BUSPAR) 15 MG tablet TAKE 1 TABLET BY MOUTH 2 TIMES DAILY. 60 tablet 1   desvenlafaxine (PRISTIQ) 100 MG 24 hr tablet Take by mouth.     desvenlafaxine (PRISTIQ) 50 MG 24 hr tablet Take 1 tablet (50 mg total) by mouth daily. Takes 150mg  total daily. 30 tablet 3   No current facility-administered medications for this visit.    Allergies-reviewed and updated Allergies  Allergen Reactions   Albuterol     Social History  Socioeconomic History   Marital status: Single    Spouse name: Not on file   Number of children: Not on file   Years of education: Not on file   Highest education level: Not on file  Occupational History   Not on file  Tobacco Use    Smoking status: Never   Smokeless tobacco: Never  Vaping Use   Vaping status: Never Used  Substance and Sexual Activity   Alcohol use: Never   Drug use: Never   Sexual activity: Never  Other Topics Concern   Not on file  Social History Narrative   Not on file   Social Determinants of Health   Financial Resource Strain: Not on file  Food Insecurity: No Food Insecurity (03/26/2021)   Received from [facility]   Hunger Vital Sign    Worried About Running Out of Food in the Last Year: Never true    Ran Out of Food in the Last Year: Never true  Transportation Needs: Not on file  Physical Activity: Not on file  Stress: Not on file  Social Connections: Unknown (06/22/2021)   Received from [facility], [facility]   Social Network    Social Network: Not on file        Objective:  Physical Exam: BP 116/61   Pulse 77   Temp 97.8 F (36.6 C) (Temporal)   Ht 6\' 3"  (1.905 m)   Wt (!) 328 lb 9.6 oz (149.1 kg)   SpO2 97%   BMI 41.07 kg/m   Body mass index is 41.07 kg/m. Wt Readings from Last 3 Encounters:  11/11/22 (!) 328 lb 9.6 oz (149.1 kg)  08/24/22 (!) 318 lb (144.2 kg)  07/29/22 (!) 310 lb (140.6 kg)   Gen: NAD, resting comfortably HEENT: TMs normal bilaterally. OP clear. No thyromegaly noted.  CV: RRR with no murmurs appreciated Pulm: NWOB, CTAB with no crackles, wheezes, or rhonchi GI: Normal bowel sounds present. Soft, Nontender, Nondistended. MSK: no edema, cyanosis, or clubbing noted Skin: warm, dry Neuro: CN2-12 grossly intact. Strength 5/5 in upper and lower extremities. Reflexes symmetric and intact bilaterally.  Psych: Normal affect and thought content     Loretta Doutt M. Jimmey Ralph, MD 11/11/2022 12:08 PM

## 2022-11-11 NOTE — Patient Instructions (Signed)
It was very nice to see you today!  We gave your flu shot today.  Please continue to work on diet and exercise.  Let us know if you would like for [place] to check blood work.  Return in about 1 year (around 11/11/2023) for Annual Physical.   Take care, Dr Jimmey Ralph  PLEASE NOTE:  If you had any lab tests, please let us know if you have not heard back within a few days. You may see your results on mychart before we have a chance to review them but we will give you a call once they are reviewed by [place].   If we ordered any referrals today, please let us know if you have not heard from their office within the next week.   If you had any urgent prescriptions sent in today, please check with the pharmacy within an hour of our visit to make sure the prescription was transmitted appropriately.   Please try these tips to maintain a healthy lifestyle:  Eat at least 3 REAL meals and 1-2 snacks per day.  Aim for no more than 5 hours between eating.  If you eat breakfast, please do so within one hour of getting up.   Each meal should contain half fruits/vegetables, one quarter protein, and one quarter carbs (no bigger than a computer mouse)  Cut down on sweet beverages. This includes juice, soda, and sweet tea.   Drink at least 1 glass of water with each meal and aim for at least 8 glasses per day  Exercise at least 150 minutes every week.     Preventive Care 80-65 Years Old, Male Preventive care refers to lifestyle choices and visits with your health care provider that can promote health and wellness. Preventive care visits are also called wellness exams. What can I expect for my preventive care visit? Counseling During your preventive care visit, your health care provider may ask about your: Medical history, including: Past medical problems. Family medical history. Current health, including: Emotional well-being. Home life and relationship well-being. Sexual activity. Lifestyle,  including: Alcohol, nicotine or tobacco, and drug use. Access to firearms. Diet, exercise, and sleep habits. Safety issues such as seatbelt and bike helmet use. Sunscreen use. Work and work Astronomer. Physical exam Your health care provider may check your: Height and weight. These may be used to calculate your BMI (body mass index). BMI is a measurement that tells if you are at a healthy weight. Waist circumference. This measures the distance around your waistline. This measurement also tells if you are at a healthy weight and may help predict your risk of certain diseases, such as type 2 diabetes and high blood pressure. Heart rate and blood pressure. Body temperature. Skin for abnormal spots. What immunizations do I need?  Vaccines are usually given at various ages, according to a schedule. Your health care provider will recommend vaccines for you based on your age, medical history, and lifestyle or other factors, such as travel or where you work. What tests do I need? Screening Your health care provider may recommend screening tests for certain conditions. This may include: Lipid and cholesterol levels. Diabetes screening. This is done by checking your blood sugar (glucose) after you have not eaten for a while (fasting). Hepatitis B test. Hepatitis C test. HIV (human immunodeficiency virus) test. STI (sexually transmitted infection) testing, if you are at risk. Talk with your health care provider about your test results, treatment options, and if necessary, the need for more tests. Follow  these instructions at home: Eating and drinking  Eat a healthy diet that includes fresh fruits and vegetables, whole grains, lean protein, and low-fat dairy products. Drink enough fluid to keep your urine pale yellow. Take vitamin and mineral supplements as recommended by your health care provider. Do not drink alcohol if your health care provider tells you not to drink. If you drink  alcohol: Limit how much you have to 0-2 drinks a day. Know how much alcohol is in your drink. In the [place] one drink equals one 12 oz bottle of beer (355 mL), one 5 oz glass of wine (148 mL), or one 1 oz glass of hard liquor (44 mL). Lifestyle Brush your teeth every morning and night with fluoride toothpaste. Floss one time each day. Exercise for at least 30 minutes 5 or more days each week. Do not use any products that contain nicotine or tobacco. These products include cigarettes, chewing tobacco, and vaping devices, such as e-cigarettes. If you need help quitting, ask your health care provider. Do not use drugs. If you are sexually active, practice safe sex. Use a condom or other form of protection to prevent STIs. Find healthy ways to manage stress, such as: Meditation, yoga, or listening to music. Journaling. Talking to a trusted person. Spending time with friends and family. Minimize exposure to UV radiation to reduce your risk of skin cancer. Safety Always wear your seat belt while driving or riding in a vehicle. Do not drive: If you have been drinking alcohol. Do not ride with someone who has been drinking. If you have been using any mind-altering substances or drugs. While texting. When you are tired or distracted. Wear a helmet and other protective equipment during sports activities. If you have firearms in your house, make sure you follow all gun safety procedures. Seek help if you have been physically or sexually abused. What's next? Go to your health care provider once a year for an annual wellness visit. Ask your health care provider how often you should have your eyes and teeth checked. Stay up to date on all vaccines. This information is not intended to replace advice given to you by your health care provider. Make sure you discuss any questions you have with your health care provider. Document Revised: 07/22/2020 Document Reviewed: 07/22/2020 Elsevier Patient  Education  2024 [employer].

## 2022-11-11 NOTE — Assessment & Plan Note (Signed)
Discussed lifestyle modifications.  Deferred checking labs today.

## 2022-11-11 NOTE — Assessment & Plan Note (Signed)
Following with psychiatry.  On Pristiq 150 mg daily.  Will follow-up with him again soon.  We discussed referral to therapy however he declined.

## 2023-05-09 ENCOUNTER — Encounter: Payer: Self-pay | Admitting: Family Medicine

## 2023-05-09 ENCOUNTER — Ambulatory Visit: Payer: PRIVATE HEALTH INSURANCE | Admitting: Family Medicine

## 2023-05-09 VITALS — BP 114/61 | HR 64 | Temp 97.0°F | Ht 75.0 in | Wt 343.8 lb

## 2023-05-09 DIAGNOSIS — J329 Chronic sinusitis, unspecified: Secondary | ICD-10-CM

## 2023-05-09 DIAGNOSIS — J309 Allergic rhinitis, unspecified: Secondary | ICD-10-CM | POA: Insufficient documentation

## 2023-05-09 MED ORDER — AMOXICILLIN-POT CLAVULANATE 875-125 MG PO TABS: 1.0000 | ORAL_TABLET | Freq: Two times a day (BID) | ORAL | 0 refills | Status: AC

## 2023-05-09 MED ORDER — AZELASTINE HCL 0.1 % NA SOLN: 2.0000 | Freq: Two times a day (BID) | NASAL | 12 refills | Status: AC

## 2023-05-09 NOTE — Assessment & Plan Note (Signed)
 Mild flare recently likely due to pollen outbreak.  This is contributing to his above sinus infection.  We will start Astelin for sinusitis as above.  He can continue this as needed as well.Marland Kitchen  He can use over-the-counter antihistamine such as Claritin or Zyrtec.

## 2023-05-09 NOTE — Progress Notes (Signed)
   Craig Tanner is a 23 y.o. male who presents today for an office visit.  Assessment/Plan:  New/Acute Problems: Sinusitis  No red flags though symptoms have been persistent for over the last week or so.  Will start Augmentin and Astelin.  He can use over-the-counter meds as needed as well.  Encouraged hydration.  He will let us know if not improving.  Chronic Problems Addressed Today: Allergic rhinitis Mild flare recently likely due to pollen outbreak.  This is contributing to his above sinus infection.  We will start Astelin for sinusitis as above.  He can continue this as needed as well.Marland Kitchen  He can use over-the-counter antihistamine such as Claritin or Zyrtec.     Subjective:  HPI:  Patient here with sinus congestion. Started over a week ago.  He has had some light green mucus. He is having more pressure and congestion. Some pain in his teeth. Tried OTC medications with some improvement. No fevers or chills. Getting a little worse the last few days.        Objective:  Physical Exam: BP 114/61   Pulse 64   Temp (!) 97 F (36.1 C) (Temporal)   Ht 6\' 3"  (1.905 m)   Wt (!) 343 lb 12.8 oz (155.9 kg)   SpO2 100%   BMI 42.97 kg/m   Wt Readings from Last 3 Encounters:  05/09/23 (!) 343 lb 12.8 oz (155.9 kg)  11/11/22 (!) 328 lb 9.6 oz (149.1 kg)  08/24/22 (!) 318 lb (144.2 kg)  Gen: No acute distress, resting comfortably HEENT: TMs clear.  OP erythematous.  Mild tonsillar hypertrophy.  Nasal mucosa erythematous and boggy bilaterally. CV: Regular rate and rhythm with no murmurs appreciated Pulm: Normal work of breathing, clear to auscultation bilaterally with no crackles, wheezes, or rhonchi Neuro: Grossly normal, moves all extremities Psych: Normal affect and thought content      Murrell Dome M. Jimmey Ralph, MD 05/09/2023 2:26 PM

## 2023-05-09 NOTE — Patient Instructions (Addendum)
 It was very nice to see you today!  I think you have a sinus infection.  Start the Astelin and Augmentin.   Return if symptoms worsen or fail to improve.   Take care, Dr Jimmey Ralph  PLEASE NOTE:  If you had any lab tests, please let us know if you have not heard back within a few days. You may see your results on mychart before we have a chance to review them but we will give you a call once they are reviewed by [place].   If we ordered any referrals today, please let us know if you have not heard from their office within the next week.   If you had any urgent prescriptions sent in today, please check with the pharmacy within an hour of our visit to make sure the prescription was transmitted appropriately.   Please try these tips to maintain a healthy lifestyle:  Eat at least 3 REAL meals and 1-2 snacks per day.  Aim for no more than 5 hours between eating.  If you eat breakfast, please do so within one hour of getting up.   Each meal should contain half fruits/vegetables, one quarter protein, and one quarter carbs (no bigger than a computer mouse)  Cut down on sweet beverages. This includes juice, soda, and sweet tea.   Drink at least 1 glass of water with each meal and aim for at least 8 glasses per day  Exercise at least 150 minutes every week.

## 2023-11-17 ENCOUNTER — Encounter: Payer: PRIVATE HEALTH INSURANCE | Admitting: Family Medicine
